# Patient Record
Sex: Male | Born: 1940 | Race: Black or African American | Hispanic: No
Health system: Southern US, Community
[De-identification: ages and names within clinical notes are randomized; demographics above are authoritative.]

---

## 2003-08-27 ENCOUNTER — Other Ambulatory Visit: Payer: Self-pay

## 2004-04-01 ENCOUNTER — Ambulatory Visit: Payer: Self-pay | Admitting: Anesthesiology

## 2004-04-22 ENCOUNTER — Emergency Department: Payer: Self-pay | Admitting: Emergency Medicine

## 2004-04-22 ENCOUNTER — Other Ambulatory Visit: Payer: Self-pay

## 2004-04-22 ENCOUNTER — Ambulatory Visit: Payer: Self-pay | Admitting: Anesthesiology

## 2004-06-17 ENCOUNTER — Ambulatory Visit: Payer: Self-pay | Admitting: Anesthesiology

## 2005-04-17 ENCOUNTER — Ambulatory Visit: Payer: Self-pay | Admitting: Family Medicine

## 2005-05-08 ENCOUNTER — Ambulatory Visit: Payer: Self-pay | Admitting: Family Medicine

## 2005-05-11 ENCOUNTER — Ambulatory Visit: Payer: Self-pay | Admitting: Gastroenterology

## 2005-05-14 ENCOUNTER — Ambulatory Visit: Payer: Self-pay | Admitting: Gastroenterology

## 2005-07-01 ENCOUNTER — Ambulatory Visit: Payer: Self-pay | Admitting: Family Medicine

## 2005-07-01 ENCOUNTER — Ambulatory Visit: Payer: Self-pay | Admitting: Nephrology

## 2005-07-14 ENCOUNTER — Ambulatory Visit: Payer: Self-pay | Admitting: Internal Medicine

## 2005-08-06 ENCOUNTER — Ambulatory Visit: Payer: Self-pay | Admitting: Internal Medicine

## 2005-09-09 ENCOUNTER — Ambulatory Visit (HOSPITAL_COMMUNITY): Admission: RE | Admit: 2005-09-09 | Discharge: 2005-09-09 | Payer: Self-pay | Admitting: *Deleted

## 2005-09-23 ENCOUNTER — Ambulatory Visit: Payer: Self-pay | Admitting: *Deleted

## 2005-09-28 ENCOUNTER — Ambulatory Visit: Payer: Self-pay | Admitting: *Deleted

## 2005-10-12 ENCOUNTER — Ambulatory Visit: Payer: Self-pay | Admitting: *Deleted

## 2005-11-11 ENCOUNTER — Ambulatory Visit: Payer: Self-pay | Admitting: Neurology

## 2006-01-18 ENCOUNTER — Ambulatory Visit: Payer: Self-pay | Admitting: Internal Medicine

## 2006-01-23 ENCOUNTER — Emergency Department: Payer: Self-pay | Admitting: Emergency Medicine

## 2006-02-06 ENCOUNTER — Ambulatory Visit: Payer: Self-pay | Admitting: Internal Medicine

## 2007-01-07 ENCOUNTER — Ambulatory Visit: Payer: Self-pay | Admitting: Internal Medicine

## 2007-01-17 ENCOUNTER — Ambulatory Visit: Payer: Self-pay | Admitting: Vascular Surgery

## 2007-01-19 ENCOUNTER — Ambulatory Visit: Payer: Self-pay | Admitting: Internal Medicine

## 2007-02-07 ENCOUNTER — Ambulatory Visit: Payer: Self-pay | Admitting: Internal Medicine

## 2007-03-09 ENCOUNTER — Ambulatory Visit: Payer: Self-pay | Admitting: Internal Medicine

## 2007-04-09 ENCOUNTER — Ambulatory Visit: Payer: Self-pay | Admitting: Internal Medicine

## 2007-04-14 ENCOUNTER — Emergency Department: Payer: Self-pay | Admitting: Emergency Medicine

## 2007-04-19 ENCOUNTER — Ambulatory Visit: Payer: Self-pay | Admitting: Internal Medicine

## 2007-05-09 ENCOUNTER — Ambulatory Visit: Payer: Self-pay | Admitting: Internal Medicine

## 2007-05-30 ENCOUNTER — Encounter: Payer: Self-pay | Admitting: Family Medicine

## 2007-08-15 ENCOUNTER — Ambulatory Visit: Payer: Self-pay | Admitting: Internal Medicine

## 2007-08-29 ENCOUNTER — Other Ambulatory Visit: Payer: Self-pay

## 2007-08-29 ENCOUNTER — Inpatient Hospital Stay: Payer: Self-pay | Admitting: Internal Medicine

## 2007-09-07 ENCOUNTER — Ambulatory Visit: Payer: Self-pay | Admitting: Internal Medicine

## 2007-10-07 ENCOUNTER — Ambulatory Visit: Payer: Self-pay | Admitting: Internal Medicine

## 2007-12-07 ENCOUNTER — Ambulatory Visit: Payer: Self-pay | Admitting: Internal Medicine

## 2007-12-21 ENCOUNTER — Ambulatory Visit: Payer: Self-pay | Admitting: Internal Medicine

## 2008-01-07 ENCOUNTER — Ambulatory Visit: Payer: Self-pay | Admitting: Internal Medicine

## 2008-01-25 ENCOUNTER — Ambulatory Visit: Payer: Self-pay | Admitting: Vascular Surgery

## 2008-02-29 ENCOUNTER — Ambulatory Visit: Payer: Self-pay | Admitting: Gastroenterology

## 2008-03-17 ENCOUNTER — Emergency Department: Payer: Self-pay | Admitting: Emergency Medicine

## 2008-06-08 ENCOUNTER — Ambulatory Visit: Payer: Self-pay | Admitting: Internal Medicine

## 2008-06-13 ENCOUNTER — Ambulatory Visit: Payer: Self-pay | Admitting: Internal Medicine

## 2008-07-09 ENCOUNTER — Ambulatory Visit: Payer: Self-pay | Admitting: Internal Medicine

## 2008-08-06 ENCOUNTER — Inpatient Hospital Stay: Payer: Self-pay | Admitting: Internal Medicine

## 2008-11-30 ENCOUNTER — Ambulatory Visit: Payer: Self-pay | Admitting: Family Medicine

## 2008-12-14 ENCOUNTER — Emergency Department: Payer: Self-pay | Admitting: Emergency Medicine

## 2009-01-29 ENCOUNTER — Inpatient Hospital Stay: Payer: Self-pay | Admitting: General Practice

## 2009-03-25 ENCOUNTER — Emergency Department: Payer: Self-pay | Admitting: Emergency Medicine

## 2009-08-27 ENCOUNTER — Emergency Department: Payer: Self-pay | Admitting: Emergency Medicine

## 2009-08-30 ENCOUNTER — Ambulatory Visit: Payer: Self-pay | Admitting: Internal Medicine

## 2009-09-06 ENCOUNTER — Ambulatory Visit: Payer: Self-pay | Admitting: Internal Medicine

## 2009-09-13 ENCOUNTER — Ambulatory Visit: Payer: Self-pay | Admitting: Internal Medicine

## 2009-09-25 ENCOUNTER — Ambulatory Visit: Payer: Self-pay | Admitting: Internal Medicine

## 2009-10-10 ENCOUNTER — Ambulatory Visit: Payer: Self-pay | Admitting: Internal Medicine

## 2009-10-24 ENCOUNTER — Ambulatory Visit: Payer: Self-pay | Admitting: Internal Medicine

## 2009-11-11 ENCOUNTER — Ambulatory Visit: Payer: Self-pay | Admitting: Internal Medicine

## 2010-01-06 ENCOUNTER — Ambulatory Visit: Payer: Self-pay | Admitting: Internal Medicine

## 2010-02-06 ENCOUNTER — Ambulatory Visit: Payer: Self-pay | Admitting: Internal Medicine

## 2010-02-21 ENCOUNTER — Ambulatory Visit: Payer: Self-pay | Admitting: Internal Medicine

## 2010-03-08 ENCOUNTER — Ambulatory Visit: Payer: Self-pay | Admitting: Internal Medicine

## 2010-10-24 ENCOUNTER — Ambulatory Visit: Payer: Self-pay | Admitting: Ophthalmology

## 2010-10-28 ENCOUNTER — Ambulatory Visit: Payer: Self-pay | Admitting: Ophthalmology

## 2011-03-02 ENCOUNTER — Ambulatory Visit: Payer: Self-pay | Admitting: Internal Medicine

## 2011-03-09 ENCOUNTER — Ambulatory Visit: Payer: Self-pay | Admitting: Internal Medicine

## 2011-04-09 ENCOUNTER — Ambulatory Visit: Payer: Self-pay | Admitting: Internal Medicine

## 2012-04-21 ENCOUNTER — Ambulatory Visit: Payer: Self-pay | Admitting: Family

## 2013-07-06 ENCOUNTER — Ambulatory Visit: Payer: Self-pay | Admitting: Gastroenterology

## 2013-07-06 LAB — DRUG SCREEN, URINE

## 2013-07-08 LAB — PATHOLOGY REPORT

## 2013-08-23 ENCOUNTER — Inpatient Hospital Stay: Payer: Self-pay | Admitting: Internal Medicine

## 2013-08-23 LAB — CBC WITH DIFFERENTIAL/PLATELET
BASOS PCT: 0.8 %
Basophil #: 0.1 10*3/uL (ref 0.0–0.1)
EOS PCT: 1.1 %
Eosinophil #: 0.1 10*3/uL (ref 0.0–0.7)
HCT: 40.8 % (ref 40.0–52.0)
HGB: 13 g/dL (ref 13.0–18.0)
Lymphocyte #: 0.7 10*3/uL — ABNORMAL LOW (ref 1.0–3.6)
Lymphocyte %: 11.3 %
MCH: 27.4 pg (ref 26.0–34.0)
MCHC: 31.9 g/dL — AB (ref 32.0–36.0)
MCV: 86 fL (ref 80–100)
MONO ABS: 0.4 x10 3/mm (ref 0.2–1.0)
Monocyte %: 6.5 %
NEUTROS PCT: 80.3 %
Neutrophil #: 5.1 10*3/uL (ref 1.4–6.5)
Platelet: 126 10*3/uL — ABNORMAL LOW (ref 150–440)
RBC: 4.74 10*6/uL (ref 4.40–5.90)
RDW: 15.6 % — AB (ref 11.5–14.5)
WBC: 6.4 10*3/uL (ref 3.8–10.6)

## 2013-08-23 LAB — COMPREHENSIVE METABOLIC PANEL
Albumin: 3.5 g/dL (ref 3.4–5.0)
Alkaline Phosphatase: 125 U/L — ABNORMAL HIGH
Anion Gap: 6 — ABNORMAL LOW (ref 7–16)
BUN: 46 mg/dL — ABNORMAL HIGH (ref 7–18)
Bilirubin,Total: 0.4 mg/dL (ref 0.2–1.0)
Calcium, Total: 8.7 mg/dL (ref 8.5–10.1)
Chloride: 106 mmol/L (ref 98–107)
Co2: 26 mmol/L (ref 21–32)
Creatinine: 2.56 mg/dL — ABNORMAL HIGH (ref 0.60–1.30)
EGFR (African American): 27 — ABNORMAL LOW
EGFR (Non-African Amer.): 24 — ABNORMAL LOW
Glucose: 113 mg/dL — ABNORMAL HIGH (ref 65–99)
Osmolality: 288 (ref 275–301)
Potassium: 4.8 mmol/L (ref 3.5–5.1)
SGOT(AST): 29 U/L (ref 15–37)
SGPT (ALT): 20 U/L (ref 12–78)
Sodium: 138 mmol/L (ref 136–145)
Total Protein: 7.8 g/dL (ref 6.4–8.2)

## 2013-08-23 LAB — URINALYSIS, COMPLETE
Bilirubin,UR: NEGATIVE
Blood: NEGATIVE
Glucose,UR: NEGATIVE mg/dL (ref 0–75)
Ketone: NEGATIVE
Nitrite: POSITIVE
Ph: 6 (ref 4.5–8.0)
RBC,UR: 1 /HPF (ref 0–5)
Specific Gravity: 1.008 (ref 1.003–1.030)
WBC UR: 4 /HPF (ref 0–5)

## 2013-08-24 LAB — CBC WITH DIFFERENTIAL/PLATELET
Basophil #: 0 10*3/uL (ref 0.0–0.1)
Basophil %: 0.6 %
EOS ABS: 0.1 10*3/uL (ref 0.0–0.7)
Eosinophil %: 1.8 %
HCT: 31.3 % — ABNORMAL LOW (ref 40.0–52.0)
HGB: 10.7 g/dL — AB (ref 13.0–18.0)
Lymphocyte #: 0.9 10*3/uL — ABNORMAL LOW (ref 1.0–3.6)
Lymphocyte %: 16.3 %
MCH: 29.2 pg (ref 26.0–34.0)
MCHC: 34.3 g/dL (ref 32.0–36.0)
MCV: 85 fL (ref 80–100)
MONO ABS: 0.7 x10 3/mm (ref 0.2–1.0)
MONOS PCT: 11.8 %
Neutrophil #: 3.9 10*3/uL (ref 1.4–6.5)
Neutrophil %: 69.5 %
Platelet: 119 10*3/uL — ABNORMAL LOW (ref 150–440)
RBC: 3.67 10*6/uL — AB (ref 4.40–5.90)
RDW: 15 % — ABNORMAL HIGH (ref 11.5–14.5)
WBC: 5.6 10*3/uL (ref 3.8–10.6)

## 2013-08-24 LAB — BASIC METABOLIC PANEL
ANION GAP: 6 — AB (ref 7–16)
BUN: 44 mg/dL — AB (ref 7–18)
Calcium, Total: 8 mg/dL — ABNORMAL LOW (ref 8.5–10.1)
Chloride: 105 mmol/L (ref 98–107)
Co2: 25 mmol/L (ref 21–32)
Creatinine: 2.69 mg/dL — ABNORMAL HIGH (ref 0.60–1.30)
EGFR (Non-African Amer.): 22 — ABNORMAL LOW
GFR CALC AF AMER: 26 — AB
GLUCOSE: 117 mg/dL — AB (ref 65–99)
Osmolality: 284 (ref 275–301)
Potassium: 4.2 mmol/L (ref 3.5–5.1)
Sodium: 136 mmol/L (ref 136–145)

## 2013-08-25 LAB — URINALYSIS, COMPLETE
Glucose,UR: NEGATIVE mg/dL (ref 0–75)
Ketone: NEGATIVE
Nitrite: POSITIVE
PH: 5 (ref 4.5–8.0)
RBC,UR: 1 /HPF (ref 0–5)
SPECIFIC GRAVITY: 1.011 (ref 1.003–1.030)

## 2013-08-25 LAB — CBC WITH DIFFERENTIAL/PLATELET
BASOS PCT: 0.4 %
Basophil #: 0 10*3/uL (ref 0.0–0.1)
Eosinophil #: 0.2 10*3/uL (ref 0.0–0.7)
Eosinophil %: 2.7 %
HCT: 29.7 % — ABNORMAL LOW (ref 40.0–52.0)
HGB: 10 g/dL — AB (ref 13.0–18.0)
LYMPHS ABS: 1 10*3/uL (ref 1.0–3.6)
Lymphocyte %: 15.5 %
MCH: 28.7 pg (ref 26.0–34.0)
MCHC: 33.7 g/dL (ref 32.0–36.0)
MCV: 85 fL (ref 80–100)
MONO ABS: 1 x10 3/mm (ref 0.2–1.0)
MONOS PCT: 14.9 %
Neutrophil #: 4.4 10*3/uL (ref 1.4–6.5)
Neutrophil %: 66.5 %
Platelet: 116 10*3/uL — ABNORMAL LOW (ref 150–440)
RBC: 3.48 10*6/uL — ABNORMAL LOW (ref 4.40–5.90)
RDW: 15.2 % — ABNORMAL HIGH (ref 11.5–14.5)
WBC: 6.6 10*3/uL (ref 3.8–10.6)

## 2013-08-26 LAB — CBC WITH DIFFERENTIAL/PLATELET
BASOS ABS: 0 10*3/uL (ref 0.0–0.1)
Basophil %: 0.5 %
EOS ABS: 0.2 10*3/uL (ref 0.0–0.7)
EOS PCT: 3.5 %
HCT: 30.2 % — ABNORMAL LOW (ref 40.0–52.0)
HGB: 10.5 g/dL — ABNORMAL LOW (ref 13.0–18.0)
Lymphocyte #: 0.6 10*3/uL — ABNORMAL LOW (ref 1.0–3.6)
Lymphocyte %: 9.2 %
MCH: 29.5 pg (ref 26.0–34.0)
MCHC: 34.6 g/dL (ref 32.0–36.0)
MCV: 85 fL (ref 80–100)
MONO ABS: 1.4 x10 3/mm — AB (ref 0.2–1.0)
MONOS PCT: 21.9 %
NEUTROS PCT: 64.9 %
Neutrophil #: 4.1 10*3/uL (ref 1.4–6.5)
PLATELETS: 129 10*3/uL — AB (ref 150–440)
RBC: 3.55 10*6/uL — AB (ref 4.40–5.90)
RDW: 14.8 % — ABNORMAL HIGH (ref 11.5–14.5)
WBC: 6.3 10*3/uL (ref 3.8–10.6)

## 2013-08-26 LAB — BASIC METABOLIC PANEL
ANION GAP: 8 (ref 7–16)
BUN: 43 mg/dL — AB (ref 7–18)
Calcium, Total: 8.5 mg/dL (ref 8.5–10.1)
Chloride: 100 mmol/L (ref 98–107)
Co2: 24 mmol/L (ref 21–32)
Creatinine: 2.81 mg/dL — ABNORMAL HIGH (ref 0.60–1.30)
EGFR (Non-African Amer.): 21 — ABNORMAL LOW
GFR CALC AF AMER: 25 — AB
Glucose: 110 mg/dL — ABNORMAL HIGH (ref 65–99)
Osmolality: 276 (ref 275–301)
POTASSIUM: 4.6 mmol/L (ref 3.5–5.1)
Sodium: 132 mmol/L — ABNORMAL LOW (ref 136–145)

## 2013-08-26 LAB — HEMOGLOBIN: HGB: 10.3 g/dL — ABNORMAL LOW (ref 13.0–18.0)

## 2013-08-26 LAB — URINE CULTURE

## 2013-08-27 LAB — CBC WITH DIFFERENTIAL/PLATELET
Basophil #: 0 10*3/uL (ref 0.0–0.1)
Basophil %: 0.5 %
EOS ABS: 0.2 10*3/uL (ref 0.0–0.7)
EOS PCT: 2.6 %
HCT: 30.2 % — ABNORMAL LOW (ref 40.0–52.0)
HGB: 10.3 g/dL — ABNORMAL LOW (ref 13.0–18.0)
Lymphocyte #: 0.6 10*3/uL — ABNORMAL LOW (ref 1.0–3.6)
Lymphocyte %: 9.7 %
MCH: 29.2 pg (ref 26.0–34.0)
MCHC: 34 g/dL (ref 32.0–36.0)
MCV: 86 fL (ref 80–100)
Monocyte #: 1.5 x10 3/mm — ABNORMAL HIGH (ref 0.2–1.0)
Monocyte %: 24.7 %
Neutrophil #: 3.9 10*3/uL (ref 1.4–6.5)
Neutrophil %: 62.5 %
Platelet: 144 10*3/uL — ABNORMAL LOW (ref 150–440)
RBC: 3.53 10*6/uL — ABNORMAL LOW (ref 4.40–5.90)
RDW: 14.9 % — ABNORMAL HIGH (ref 11.5–14.5)
WBC: 6.3 10*3/uL (ref 3.8–10.6)

## 2013-08-27 LAB — BASIC METABOLIC PANEL
ANION GAP: 7 (ref 7–16)
BUN: 44 mg/dL — ABNORMAL HIGH (ref 7–18)
CO2: 23 mmol/L (ref 21–32)
CREATININE: 2.82 mg/dL — AB (ref 0.60–1.30)
Calcium, Total: 8.4 mg/dL — ABNORMAL LOW (ref 8.5–10.1)
Chloride: 103 mmol/L (ref 98–107)
EGFR (African American): 24 — ABNORMAL LOW
GFR CALC NON AF AMER: 21 — AB
Glucose: 83 mg/dL (ref 65–99)
Osmolality: 277 (ref 275–301)
Potassium: 4.6 mmol/L (ref 3.5–5.1)
Sodium: 133 mmol/L — ABNORMAL LOW (ref 136–145)

## 2013-08-27 LAB — URIC ACID: Uric Acid: 6.6 mg/dL (ref 3.5–7.2)

## 2013-08-27 LAB — URINE CULTURE

## 2013-08-28 LAB — HEPATIC FUNCTION PANEL A (ARMC)
Albumin: 2.5 g/dL — ABNORMAL LOW (ref 3.4–5.0)
Alkaline Phosphatase: 88 U/L
BILIRUBIN DIRECT: 0.3 mg/dL — AB (ref 0.00–0.20)
BILIRUBIN TOTAL: 0.5 mg/dL (ref 0.2–1.0)
SGOT(AST): 67 U/L — ABNORMAL HIGH (ref 15–37)
SGPT (ALT): 32 U/L (ref 12–78)
Total Protein: 6.7 g/dL (ref 6.4–8.2)

## 2013-08-28 LAB — CREATININE, SERUM
Creatinine: 2.86 mg/dL — ABNORMAL HIGH (ref 0.60–1.30)
EGFR (African American): 24 — ABNORMAL LOW
EGFR (Non-African Amer.): 21 — ABNORMAL LOW

## 2013-08-28 LAB — SYNOVIAL CELL COUNT + DIFF, W/ CRYSTALS
Basophil: 0 %
CRYSTALS, JOINT FLUID: NONE SEEN
Eosinophil: 3 %
Lymphocytes: 6 %
NEUTROS PCT: 84 %
Nucleated Cell Count: 11410 /mm3
OTHER CELLS BF: 0 %
Other Mononuclear Cells: 7 %

## 2013-08-28 LAB — SEDIMENTATION RATE: Erythrocyte Sed Rate: 81 mm/hr — ABNORMAL HIGH (ref 0–20)

## 2013-08-28 LAB — RAPID HIV-1/2 QL/CONFIRM: HIV-1/2,Rapid Ql: NEGATIVE

## 2013-08-30 LAB — CULTURE, BLOOD (SINGLE)

## 2013-08-30 LAB — WOUND CULTURE

## 2013-09-01 LAB — BODY FLUID CULTURE

## 2013-11-09 ENCOUNTER — Encounter: Payer: Self-pay | Admitting: Surgery

## 2013-11-13 LAB — WOUND AEROBIC CULTURE

## 2013-12-06 ENCOUNTER — Encounter: Payer: Self-pay | Admitting: Surgery

## 2014-07-09 DEATH — deceased

## 2014-09-29 NOTE — H&P (Signed)
PATIENT NAME:  Erik Harvey, ZOLL MR#:  [id number] DATE OF BIRTH:  [birth year]  DATE OF ADMISSION:  08/23/2013  PRIMARY CARE PHYSICIAN: [facility].  EMERGENCY ROOM PHYSICIAN: Dr. Margarita Grizzle.   CHIEF COMPLAINT: Fall.   HISTORY OF PRESENT ILLNESS: The patient is a 74 year old male patient with multiple medical problems of hypertension, diabetes, legal blindness. Went to [employer] today. The patient is a wheelchair-bound patient. The patient was waiting for his ride. On the way out the patient did not see the step down, and the patient fell forward from the wheelchair, hitting his head on the door and also injuring the right knee. The patient suffered a right knee fracture and also pubic ramus fracture, and theseprobably need conservative treatment.t. The patient will be admitted to medical service. Will consult orthopedic physician, Dr. Martha Clan on call. The patient denies any chest pain or trouble breathing. No cough. No fever. Lives alone. Legally blind in the right eye and completely blind in the left eye and uses wheelchair for his activities.   PAST MEDICAL HISTORY: Significant for:  1.  Hypertension. 2.  History of monoclonal gammopathy of unknown significance. 3.  History of diabetes mellitus. 4.  Coronary artery disease.  5.  Peripheral vascular disease.  6.  Glaucoma.  7.  Low back pain.  8.  Also has a history of hepatitis C. 9.  History of polysubstance abuse in the past.   PAST SURGICAL HISTORY: Significant for right hip surgery.   FAMILY HISTORY: The patient's father had lung cancer, and sister had breast cancer.   SOCIAL HISTORY: Smoker. The patient tried to quit before but unsuccessful, smoked for about 50 to 60 years and smokes about 1 pack per day. No alcohol. No drugs. Used to use recreational drugs before.   ALLERGIES: LISINOPRIL AND IBUPROFEN.   MEDICATIONS: We are still waiting for the [facility] to send the medication list, but the patient's medications previous as  per family: The patient is on amlodipine 10 mg daily, Aldactone 25 mg p.o. daily, tamsulosin 0.4 mg daily. The patient is also on sucralfate 1 gram p.o. 4 times daily, Imdur 30 mg once a day, Actos 30 mg p.o. daily, Crestor 10 mg p.o. daily, trazodone 50 mg at bedtime, clonidine 0.2 mg p.o. b.i.d., aspirin 81 mg daily, omeprazole 20 mg p.o. daily, Zoloft 100 mg at bedtime, hydralazine 20 mg p.o. t.i.d., olanzapine 5 mg p.o. at bedtime. This medication list will be updated once we get it from [facility], and once pharmacy puts them in the computer, we will reassess them.   REVIEW OF SYSTEMS:   CONSTITUTIONAL: No fever. No fatigue. No weakness.  EYES: The patient has legal blindness in the right eye and completely blind in the left eye.  EARS, NOSE, THROAT: No tinnitus. No ear pain. No epistaxis. No difficulty swallowing.  RESPIRATORY: No cough. No wheezing.  CARDIOVASCULAR: No chest pain. No orthopnea.  GASTROINTESTINAL: No nausea. No vomiting. No abdominal pain.  GENITOURINARY: No dysuria. No hematuria.  ENDOCRINE: No polyuria or nocturia.  INTEGUMENTARY: No skin rashes.  MUSCULOSKELETAL: No joint pains.  NEUROLOGIC: No numbness or weakness.  PSYCHIATRIC: No anxiety or insomnia.   PHYSICAL EXAMINATION:  VITAL SIGNS: Temperature 98.4, heart rate 58, blood pressure 177/85, sats 100% on room air.  GENERAL: Has a head laceration, which was stitched in the Emergency Room and also has right conjunctival hemorrhage. The patient is alert, awake, oriented, answering questions appropriately.  HEAD: Atraumatic, normocephalic.  EYES: Pupils equally reacting to  light. Extraocular movements are intact.  EARS, NOSE, THROAT: No tympanic membrane congestion. No turbinate hypertrophy. No oropharyngeal erythema.  NECK: Supple. No JVD. No carotid bruit. Normal range of motion. The patient's neck is nontender.  RESPIRATORY: Good respiratory effort. Clear to auscultation. No wheeze. No rales. The patient is not  using accessory muscles of respiration.  CARDIOVASCULAR: S1, S2 regular. No murmurs. The patient has femoral and pedal pulse intact. No peripheral edema.  GASTROINTESTINAL: Abdomen is nontender, nondistended. Bowel sounds present. No organomegaly. No hernias.  MUSCULOSKELETAL: The patient does have right knee pain, and the patient's right knee is immobilized.  SKIN: Inspection is normal.  LYMPHATICS: No lymphadenopathy.  NEUROLOGIC: Cranial nerves II through XII are intact.  Power 5/5 in upper and lower extremities. Sensation is intact. DTRs 2+ bilaterally.  PSYCHIATRIC: Mood and affect are within normal limits.   LABORATORY AND RADIOLOGICAL DATAxray right knee showed :A cute mildly displaced distal transverse fracture of the distal femoral metaphysis is noted which was described on prior exam; the previously placed intra medullary rod provides no stability for this fracture.  Right knee:  Transverse fracture, relatively nondisplaced, through the distal femoral metaphysis, in the vicinity of the tip of the intramedullary nail. I do not see definite extension to the intercondylar notch although negative predictive value for such extension is low due to the severity of spurring and bony demineralization. CT may be warranted for complete characterization. 2. 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 3. Atherosclerosis. 4. Heterotopic ossification in the distal femur and along the proximal pole of the patella.  . Hip x-ray on the right side  shows Multiple chronic abnormalities. Hairline fracture right pubic ramus suspected.   intramedullary rod in the right femur present. Cervical spine CT is normal. Head CT shows frontal scalp laceration with soft tissue swelling, negative for skull fracture.   Electrolytes: Sodium is 138, potassium 4.8, chloride 106, bicarbonate 6; BUN is 46, creatinine 2.56, glucose 113. WBC 6.4, hemoglobin 13, hematocrit 40.8, platelets 126.  Urinalysis is clear, no leukocyte esterase.   ASSESSMENT AND PLAN: The patient is a 74 year old male patient with:  1.  Fall, suffered a right femur fracture and also pubic ramus fracture. The patient is admitted to observation status and will continue pain medications with Dilaudid 2 mg every 4 hours and continue Lovenox 40 mg subcutaneous daily. Obtain orthopedic consult with Dr. Martha ClanKrasinski.  2.  Chronic renal insufficiency, stable kidney function.  3.  Hypertension. The patient is on clonidine, furosemide, hydralazine, Imdur. At this time, we will continue them. Once we get the list, we are going to update the medications.  4.  History of peripheral vascular disease.  Aspirin and statins to be continued.  5.  Diabetes mellitus type 2. Continue sliding scale with coverage along with Actos.  6.  Gastrointestinal prophylaxis with proton pump inhibitors.  7.  Benign prostatic hypertrophy. Continue Flomax. 8.  Scalp laceration, status post suturing in the Emergency Room. The patient also received tetanus toxoid/Tdap vaccine in the ER.   TIME SPENT: About 60 minutes.    ____________________________ Katha HammingSnehalatha Dawnisha Marquina, MD sk:jcm D: 08/23/2013 15:38:39 ET T: 08/23/2013 18:17:07 ET JOB#: [id number]  cc: Katha HammingSnehalatha Elvie Maines, MD, <Dictator> Katha HammingSNEHALATHA Tonyia Marschall MD ELECTRONICALLY SIGNED 09/22/2013 21:49

## 2014-09-29 NOTE — Discharge Summary (Signed)
PATIENT NAME:  Erik Harvey, Erik Harvey MR#:  [id number] DATE OF BIRTH:  1940-07-23  DATE OF ADMISSION:  08/23/2013 DATE OF DISCHARGE:  08/30/2013  PRIMARY CARE PHYSICIAN: Viviann Spare, MD  FINAL DIAGNOSES:  1.  Fever.  2.  Right femoral fracture.  3.  Right pubic ramus fracture.  4.  Hypertension.  5.  Chronic kidney disease.  6.  Acute blood loss anemia.  7.  Diabetes.  8.  Constipation.  9.  History of seizure.  10. Benign prostatic hypertrophy.  11. Gastroesophageal reflux disease.   MEDICATIONS ON DISCHARGE: Include omeprazole 20 mg twice a day, Avodart 0.5 mg daily, Dilantin 100 mg 3 times a day, spironolactone 25 mg once a day, Drisdol 50,000 units 1 capsule once a week on Mondays for 8 weeks then once a month for vitamin D supplement, Coreg 12.5 mg twice a day, Crestor 20 mg at bedtime, Colace 100 mg 1 to 2 capsules once a day at bedtime as needed for constipation, Naftin 1% topical cream applied to foot between toenails and toes twice a day until clear, sodium bicarbonate 650 mg orally once a day, Ventolin HFA 2 puffs every 4 to 6 hours as needed for cough or shortness of breath, triamcinolone topical 0.1% topical cream applied to rash on top of feet and ankles twice a day until clear, Tylenol 650 mg 2 tablets every 8 hours as needed for pain, trazodone 50 mg 1.5 tablets 75 mg orally once a day at bedtime for insomnia, furosemide 40 mg twice a day for blood pressure and edema,  topical ointment apply to affected area twice a day as needed for dryness, Combigan 0.2%/0.5% ophthalmic solution one drop right eye 3 times a day for glaucoma, MiraLax 17 grams in 8 ounces of water daily, Imdur 30 mg daily, hydralazine 50 mg 3 times a day, Zoloft 100 mg at bedtime, Norvasc 10 mg daily, Carafate 1 gram orally 3 times a day, aspirin 81 mg daily, Travatan 0.004% ophthalmic solution 1 drop left eye once a day at bedtime, Zyprexa 5 mg at bedtime, Flomax 0.4 mg daily, acetaminophen/oxycodone 10/325, 1 tablet every  4 hours as needed for pain, clonidine 0.2 mg twice a day, enoxaparin 30 mg subcutaneous injection once a day, doxycycline 100 mg every 12 hours for 7 days then stop and Glucerna shake 237 mL twice a day.   TREATMENT: Right leg immobilizer.   DIET: Low-sodium diet, regular consistency.   ACTIVITY: As tolerated.  FOLLOWUP: With Dr. Mack Guise orthopedics in 2 weeks, physical therapy in 1 to 2 weeks with doctor at rehab.   HOSPITAL COURSE: The patient was admitted August 23, 2013 and discharged August 30, 2013. Please see discharge summary dictated by Dr. Darvin Neighbours on March 21. This will be an addendum on the hospital course from then. The reason why the patient was not discharged from the hospital on the 21st was fever. He continued to have fevers. He spiked a temperature as high as 104. The patient was initially thought to have a urinary tract infection and was initially placed on IV Rocephin. The patient continued to spike fever, was increased on antibiotics to Invanz and vancomycin. Dr. Sabra Heck orthopedics covering for Dr. Mack Guise saw the patient and did a tap of the knee. Dr. Ola Spurr from infectious disease saw the patient on March 23 did a retap of the right knee. It does not look like the knee is the source of infection. Blood cultures were negative. White count normal range. Temperature curve did come  down and currently afebrile with a temperature of 98.8 on March 25. The patient was switched over to oral doxycycline and will continue another week course of this. I did leave him on Lovenox injections, subcutaneous injection every 24 hours until more ambulatory. Can walk with physical therapy, but has been pretty limited here in the hospital. Last creatinine was 2.86. HIV test was negative. C-reactive protein up at 258, ESR up at 8. Last hemoglobin 10.3. Uric acid 6.6.   TIME SPENT ON DISCHARGE: 35 minutes.   DISPOSITION: The patient discharged to rehab in stable condition.   ____________________________ [name]. [name], MD rjw:aw D: 08/30/2013 08:53:10 ET T: 08/30/2013 08:59:07 ET JOB#: [id number]  cc: [name]. [name], MD, <Dictator> Colette S. Zenia Resides, NP Marisue Brooklyn MD ELECTRONICALLY SIGNED 09/01/2013 16:29

## 2014-09-29 NOTE — Consult Note (Signed)
Brief Consult Note: Diagnosis: Right distal femur fracture.   Patient was seen by consultant.   Recommend further assessment or treatment.   Orders entered.   Comments: Patient is a 74 year old male who fell out of his wheelchair today when he went outside to check on his ride and went over a curb throwing him forward out of the chair.  He hit his forehead, just above his right eye, and right knee on the ground.  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  [facility] where his laceration was sutured closed.  He was diagnosed with a fracture of the right femur just below a long rod placed by Dr. Ernest PineHooten in 2010 for a comminuted intertrochanteric hip fracture.  I am consulted regarding this fracture.  On exam, he has intact skin over the right knee with a hemarthrosis.  There is global tenderness around the knee.  Knee motion was not tested.  The leg and thigh compartments are soft and compressible.  Distally he can flex and extend his toes and ankle.  He has palpable pedal pulses, but sensation is diminished due to diabetic neuropathy which is chronic.  Radiographs from ER demonstrate a non-displaced fracture of the right distal femur which has a transverse component at the tip of the intramedullary rod.  There appears to be an intercondylar component as well which is also non-displaced.  I am recommending non-operative management of this fracture at this time as the fracture is non-displaced and the patient is non-ambulatory at baseline.   He is in a knee immobilizer which he should wear at all times.  He should ice and elevate.  PT evaluation in the morning for NWB on right LE for transfers to a chair or wheelchair.  He will likely need SNF placement as he lives alone and will likely be unsafe to go  home alone.  Electronic Signatures: Juanell FairlyKrasinski, Rielly Corlett (MD)  (Signed 18-Mar-15 21:19)  Authored: Brief Consult Note   Last Updated: 18-Mar-15 21:19 by Juanell FairlyKrasinski, Gionni Freese  (MD)

## 2014-09-29 NOTE — Discharge Summary (Signed)
PATIENT NAME:  Eligha BridegroomATE, Ariel W MR#:  [id number] DATE OF BIRTH:  1941-01-29  DATE OF ADMISSION:  08/23/2013  DATE OF DISCHARGE:  08/26/2013  DISPOSITION:  Discharged to a skilled nursing facility.   DISCHARGE DIAGNOSES: 1.  Right femur fracture.  2.  Right pubic ramus fracture.  3.  Urinary tract infection.  4.  Atelectasis.  5.  Acute blood loss anemia.  6.  Hypertension.  7.  Diabetes mellitus.  8.  Chronic kidney disease stage III.   IMAGING STUDIES: Done include an x-ray of the hip and knee, which showed a right distal femoral fracture and hairline right pubic ramus fracture.   CONSULTATIONS: Dr. Martha ClanKrasinski.   ADMITTING HISTORY AND PHYSICAL: Please see detailed H and P dictated earlier by Dr. Luberta MutterKonidena. In brief, a [age] year old African American male patient, who is wheelchair bound, fell off from a step when his wheelchair rolled.  The patient fell forward hitting his head. Presented to the Emergency Room where his CT scan of the head and neck were normal without any fractures or dislocation, but his right femur fracture and pubic ramus fracture was found, admitted to the hospitalist service.   HOSPITAL COURSE: The patient was seen by orthopedics, who had suggested conservative management as the patient is wheelchair bound at baseline, unable to walk. The patient has had good pain control with pain medications. He did have fever of 101 today, on 08/25/2013, likely from the UTI, has been started on ceftriaxone, although his fever could also be originating from atelectasis found on the chest x-ray. I have added incentive spirometer. At the time of discharge, the patient can be discharged on oral ciprofloxacin depending on culture results. If the patient is afebrile on 08/26/2013, and doing well, he can be discharged back to skilled nursing facility. He has had elevated blood pressure secondary to pain medications; restarted on his home medication list, along with IV p.r.n. antihypertensive  medications.   He has mild acute blood loss anemia from the fractures. No need of transfusion. His diabetes is fairly controlled.   DISCHARGE MEDICATIONS: 1.  Percocet 10/325, 1 tablet 6 times a day as needed for pain.  2.  Clonidine 0.2 mg oral 2 times a day.  3.  Ciprofloxacin XR 500 mg oral once a day.  4.  Flomax 0.4 mg oral once a day.  5.  Zyprexa 5 mg oral once a day.  6.  Travatan  ophthalmic solution, 1 drop to left eye once a day at bedtime.  7.  Aspirin 81 mg daily.  8.  Carafate 1 gram oral 3 times a day.  9.  Norvasc 10 mg oral once a day.  10.  Zoloft 100 mg oral once a day.  11.  Hydralazine 50 mg oral 3 times a day.  12.  Isosorbide mononitrate 30 mg oral once a day.  13.  MiraLAX 17 grams once a day as needed for constipation.  14.  Combigan ophthalmic, 1 drop to right eye 3 times a day for glaucoma.  15.  Lasix 40 mg oral 2 times a day.  16.  Trazodone 50 mg 1.5 tablets oral once a day at bedtime.  17.  Tylenol 650 mg 2 tablets oral every 8 hours as needed for pain.  18.  Triamcinolone topical 0.1% cream, apply to rash 2 times a day.  19.  Sodium bicarbonate 650 mg oral once a day.  20.  Colace 100 mg 1 to 2 tablets oral once a day as needed.  21.  Crestor 20 mg daily.  22.  Coreg 12.5 mg oral 2 times a day.  23.  Drisdol 50,000 International Units oral once a week.  24.  Spironolactone 25 mg daily.  25.  Dilantin 100 mg oral 3 times a day.  26.  Avodart 0.5 mg oral once a day.  27.  Omeprazole 20 mg 2 times a day.  28.  Lovenox 30 mg subQ daily for 2 weeks.   DISCHARGE INSTRUCTIONS: Low sodium, carbohydrate-controlled diet. Activity as tolerated. Follow up with Dr. Martha Clan in nephrology in 1 to 2 weeks.   Time spent today on this case was 40 minutes.    ____________________________ Molinda Bailiff Pammie Chirino, MD srs:dmm D: 08/25/2013 14:57:00 ET T: 08/25/2013 21:24:08 ET JOB#: [id number]  cc: Wardell Heath R. Elpidio Anis, MD, <Dictator> Kathreen Devoid, MD Orie Fisherman  MD ELECTRONICALLY SIGNED 09/02/2013 10:29

## 2014-09-29 NOTE — Consult Note (Signed)
Chief Complaint:  Subjective/Chief Complaint Fever and right knee fracture.   Less pain after aspiration.  white blood count still low at 6.3   VITAL SIGNS/ANCILLARY NOTES: **Vital Signs.:   22-Mar-15 10:17  Vital Signs Type Q 4hr  Temperature Temperature (F) 99.1  Celsius 37.2  Pulse Pulse 76  Respirations Respirations 18  Systolic BP Systolic BP 379  Diastolic BP (mmHg) Diastolic BP (mmHg) 74  Mean BP 93  Pulse Ox % Pulse Ox % 97  Oxygen Delivery Room Air/ 21 %   Brief Assessment:  GEN well developed, well nourished   Respiratory normal resp effort   EXTR negative edema   Additional Physical Exam Not as warm to touch.  Alert and comfortable.  X-rays show some shifting of fracture but acceptable position.  Probable cast treatment.  No reddness or cellulitis at knee.  Early culture results negative.   Lab Results: LabObservation:  21-Mar-15 15:32   OBSERVATION Reason for Test Swelling, pain knee, r/o dvt  Routine Micro:  21-Mar-15 12:14   Micro Text Report URINE CULTURE   COMMENT                   NO GROWTH IN 18-24 HOURS   ANTIBIOTIC                       Specimen Source CLEAN CATCH  Culture Comment NO GROWTH IN 18-24 HOURS  Result(s) reported on 27 Aug 2013 at 11:22AM.    19:11   Micro Text Report WOUND AER/ANAEROBIC CULT   COMMENT                   NO GROWTH IN 8-12 HOURS   ANTIBIOTIC                       Specimen Source R KNEE  Culture Comment NO GROWTH IN 8-12 HOURS  Result(s) reported on 27 Aug 2013 at 12:56PM.  Routine Chem:  21-Mar-15 19:54   Glucose, Serum  110  BUN  43  Creatinine (comp)  2.81  Sodium, Serum  132  Potassium, Serum 4.6  Chloride, Serum 100  CO2, Serum 24  Calcium (Total), Serum 8.5  Anion Gap 8  Osmolality (calc) 276  eGFR (African American)  25  eGFR (Non-African American)  21 (eGFR values <9m/min/1.73 m2 may be an indication of chronic kidney disease (CKD). Calculated eGFR is useful in patients with stable renal  function. The eGFR calculation will not be reliable in acutely ill patients when serum creatinine is changing rapidly. It is not useful in  patients on dialysis. The eGFR calculation may not be applicable to patients at the low and high extremes of body sizes, pregnant women, and vegetarians.)  Routine Hem:  21-Mar-15 05:02   Hemoglobin (CBC)  10.3 (Result(s) reported on 26 Aug 2013 at 05:45AM.)    19:54   WBC (CBC) 6.3  RBC (CBC)  3.55  Hemoglobin (CBC)  10.5  Hematocrit (CBC)  30.2  Platelet Count (CBC)  129  MCV 85  MCH 29.5  MCHC 34.6  RDW  14.8  Neutrophil % 64.9  Lymphocyte % 9.2  Monocyte % 21.9  Eosinophil % 3.5  Basophil % 0.5  Neutrophil # 4.1  Lymphocyte #  0.6  Monocyte #  1.4  Eosinophil # 0.2  Basophil # 0.0 (Result(s) reported on 26 Aug 2013 at 08:13PM.)   Radiology Results: XRay:    21-Mar-15 22:27, Knee Right AP  and Lateral  Knee Right AP and Lateral   REASON FOR EXAM:    fever, knee effusion, recent fx  COMMENTS:       PROCEDURE: DXR - DXR KNEE RIGHT AP AND LATERAL  - Aug 26 2013 10:27PM     CLINICAL DATA:  Right leg fracture.    EXAM:  RIGHT KNEE - 1-2 VIEW    COMPARISON:  August 23, 2013.    FINDINGS:  Intramedullary rod fixation of distal femur is noted. There is again  noted acute mildly displaced transverse fracture involving the  distal femoral metaphysis which is unchanged compared to prior exam  ; the previously placed intramedullary rod provides no stability for  this new fracture. 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     IMPRESSION:  Acute mildly displaced distal transverse fracture of the distal  femoral metaphysis is noted which was described on prior exam; the  previously placed intra medullary rod provides no stability for this  fracture.      Electronically Signed    By: Sabino Dick M.D.    On: 08/27/2013 07:57     Verified By: Marveen Reeks, M.D.,   Assessment/Plan:  Assessment/Plan:   Assessment Right knee fracture and sepsis. Source of infectin pending.  Doubt knee involved.   Plan knee immobilizer for now. May need cast.  Physical Therapy as tolerated.   Electronic Signatures: Park Breed (MD)  (Signed 22-Mar-15 14:14)  Authored: Chief Complaint, VITAL SIGNS/ANCILLARY NOTES, Brief Assessment, Lab Results, Radiology Results, Assessment/Plan   Last Updated: 22-Mar-15 14:14 by Park Breed (MD)

## 2014-09-29 NOTE — Consult Note (Signed)
PATIENT NAME:  Erik Harvey, Erik Harvey MR#:  [id number] DATE OF BIRTH:  11-08-1940  DATE OF CONSULTATION:  08/28/2013  REFERRING PHYSICIAN:  Loletha Grayer, MD CONSULTING PHYSICIAN:  Cheral Marker. Ola Spurr, MD  REASON FOR CONSULTATION: Fever.   HISTORY OF PRESENT ILLNESS: This is a very pleasant 74 year old gentleman with history of prior CVAs who is wheelchair-bound. He was admitted March 18th after he fell from his wheelchair. Prior to that he had been in his usual state of health. He was found to have a right femur fracture as well as pubic ramus fracture. The patient was admitted to the medical service. It was nonoperative. The patient then however developed fevers starting on March 20th. He has spiked as high as 104.7 on March 21st. No obvious source has been discovered. He has had UA, urine culture, blood cultures, CT of his head and tap of his knee. Cultures from all of those are negative. He has been maintained on antibiotics including vancomycin and amlodipine.   Currently, the patient denies any headaches, sore throat, ear pain, neck pain or stiffness, chest pain, or shortness of breath. He does have a mild chronic cough but it is nonproductive and not changed in nature. He has had constipation since admission and has not had a bowel movement in 7 days, but he just had one today prior to our evaluation. He has some difficulty urinating where he find like he cannot completely void. He however has no dysuria.   PAST MEDICAL HISTORY: 1.  Chronic hepatitis C.  2.  Hypertension.  3.  Prior CVA.  4.  Diabetes. 5.  MGUS.  6.  Coronary artery disease.  7.  Peripheral vascular disease.  8.  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  9.  Chronic low back pain.  10.  Polysubstance abuse in the past.   PAST SURGICAL HISTORY: Right hip surgery.   FAMILY HISTORY: Positive for lung cancer in the father. Sister had breast cancer.   SOCIAL HISTORY: The patient smokes a pack a day and has for many years.  He denies any alcohol or drugs. He does not use any other recreational drugs. He says he lives at home, is able to transfer himself from his bed to his wheelchair.   ALLERGIES: LISINOPRIL AND IBUPROFEN.  REVIEW OF SYSTEMS: Eleven  systems reviewed and negative, except as per HPI.   ANTIBIOTICS SINCE ADMISSION: Include: Vancomycin, begun on March 20th as well as ertapenem begun March 20th. He also received a dose of ceftriaxone.   OTHER MEDICATIONS: Include his outpatient meds: amlodipine, clonidine, Avodart, Lasix, Imdur, Zyprexa, Zofran, pantoprazole, Crestor, spironolactone, Flomax, Percocet, enoxaparin, Tylenol, hydralazine, MiraLax, Colace, lactulose.   PHYSICAL EXAMINATION: VITALS: T-max since admission 104.7 on March 21st. He has continued to spike each day, but his most recent T-max is 102.9 on March 22nd at 5:00 p.m. Pulse is 69, blood pressure 122/66, respirations 17, and sat 93% on room air.  GENERAL: He is pleasant, interactive, sitting up in bed. HEENT: Pupils reactive, left is cloudy. Oropharynx is clear with no thrush.  NECK: Supple.  HEART: Regular.  LUNGS: Decreased breath sounds in right base, otherwise clear.  ABDOMEN: Soft, nontender, nondistended. No hepatosplenomegaly.  EXTREMITIES: His left leg has no clubbing, cyanosis or edema. His right knee is in an immobilizer. On further evaluation, after removal, the knee is quite swollen and warm. There are no skin sores, ulcers or decubitus ulcers.  NEUROLOGIC: He has contractures of his right upper extremity and strength is  diminished, but he is alert and oriented.   DIAGNOSTIC DATA: Labs are reviewed.   CT of his head March 18th showed no intracranial abnormalities.   CT of the neck showed advanced cervical spondylosis, but negative for fracture.   Chest x-ray March 21st showed bilateral basilar segmental atelectasis.   X-ray of his knee showed acute mildly displaced transverse distal fracture of the femoral metaphysis.  The previously placed intramedullary rod provides no stability for this fracture.  White blood count on admission was 6.4 and currently 6.3, hemoglobin 10.3, platelets 144,000. On admission they were 126,000. LFTs on admission normal except for alk phos slightly elevated at 125. Renal function shows a creatinine of 2.86. GFR is 24. Blood cultures x2, March 20th, are negative. Urine culture, March 20th, is mixed bacterial flora. UA showed 22 white cells. Repeat urine culture March 21st was negative. Aspiration of the right knee, March 21st, was grossly bloody and had no evidence of growth.  IMPRESSION: A pleasant 74 year old gentleman wheelchair bound from prior cerebrovascular accidents admitted with a fall out of his wheelchair and fracture of his femur and following admission he then developed high fevers to 104 and has been febrile since. His white count remained stable and he is hemodynamically stable. He has been on vancomycin and ertapenem and actually got a dose of antibiotics prior to the knee tap. Analysis was not sent on the knee but culture is negative.   Several sources of his fever exists: 1.  Knee. I did re-aspirate the knee and will send it for cell count and differential, although it was grossly bloody. We will repeat cultures on it as well.  2.  Other sources would include a gastrointestinal source given his constipation. He does have a pelvic fracture. There would be some concern he has some hemoperitoneum or intra-abdominal abscesses, although he is really markedly nontender there and has started to move his bowels. He also has no white count. Drug fever could be a possibility, but the only drug on his list that could potentially cause this is hydralazine and he has been on this for some time. However, he is also on olanzapine, but that also seems chronic.  3.  Another source could be urine given that he is having some difficulty with urination.   RECOMMENDATIONS: 1.  I have tapped the  knee and will send it for cell count and differential and repeat culture.  2.  I have asked the nurse to bladder scan him to see if there are any issues with retained urine that may need a Foley catheter and further evaluation.  3.  I have checked an ESR and CRP as well as an HIV test.  4.  If this testing is negative, I would suggest we stop all antibiotics and monitor. I am not sure what we would be treating or duration or source and it would be best to monitor him off of antibiotics.   Thank you for the consult. I will be glad to follow with you.  ____________________________ Cheral Marker. Ola Spurr, MD dpf:sb D: 08/28/2013 15:30:00 ET T: 08/28/2013 15:53:39 ET JOB#: [id number]  cc: Cheral Marker. Ola Spurr, MD, <Dictator> Sabin Gibeault Ola Spurr MD ELECTRONICALLY SIGNED 09/09/2013 13:27

## 2014-09-29 NOTE — Consult Note (Signed)
Chief Complaint:  Subjective/Chief Complaint Right distal femur fracture with large knee effusion and fevers.  Asked to recheck patient by Dr Hilton SinclairWeiting because of onset of fevers last 24 hrs.  Transfer to skilled nursing facility held due to this.    Exam:  Patient alert but extremely warm with shaking chills.  Temp to 104*  Right knee with 4+ effusion but no warmer than other knee. No reddness or cellulitis.  circulation/sensation/motor function good distally. Minimal pain.   VITAL SIGNS/ANCILLARY NOTES: **Vital Signs.:   21-Mar-15 13:30  Vital Signs Type Routine  Temperature Temperature (F) 99.4  Celsius 37.4  Temperature Source oral  Pulse Pulse 72  Respirations Respirations 20  Systolic BP Systolic BP 122  Diastolic BP (mmHg) Diastolic BP (mmHg) 68  Mean BP 86  Pulse Ox % Pulse Ox % 93  Pulse Ox Activity Level  At rest  Oxygen Delivery Room Air/ 21 %   Brief Assessment:  EXTR negative edema   Additional Physical Exam as above   Lab Results: LabObservation:  21-Mar-15 15:32   OBSERVATION Reason for Test Swelling, pain knee, r/o dvt  Routine Hem:  21-Mar-15 05:02   Hemoglobin (CBC)  10.3 (Result(s) reported on 26 Aug 2013 at 05:45AM.)   Assessment/Plan:  Assessment/Plan:  Assessment Large right knee bloody effusion secondary to fracture. Fever of unknown origin   Plan Right knee aspirated of 120 cc of blood.  Some clotted blood remained.  Knee fluid sent for cultures aerobic and anaerobic Ace, ice, and knee immobilizer for knee. Fever workup by Dr Mody--discussed with her Repeat X-rays right knee to assess fracture position   Electronic Signatures: Valinda HoarMiller, Kashish Yglesias E (MD)  (Signed 21-Mar-15 19:36)  Authored: Chief Complaint, VITAL SIGNS/ANCILLARY NOTES, Brief Assessment, Lab Results, Assessment/Plan   Last Updated: 21-Mar-15 19:36 by Valinda HoarMiller, Sanay Belmar E (MD)

## 2015-04-01 IMAGING — CT CT HEAD WITHOUT CONTRAST
4 of 8 series · 13 of 33 positions shown, 14 images · non-contrast
Comparison: None.

CLINICAL DATA: Fall

EXAM:
CT HEAD WITHOUT CONTRAST
CT CERVICAL SPINE WITHOUT CONTRAST
TECHNIQUE: Multidetector CT imaging of the head and cervical spine was
performed following the standard protocol without intravenous
contrast. Multiplanar CT image reconstructions of the cervical spine
were also generated.

[Series 5: c spine soft · axial · 0.31mm/px · z∈[+312,+360]mm · 2 of 74 slices shown]
[im 25/74  soft-tissue]
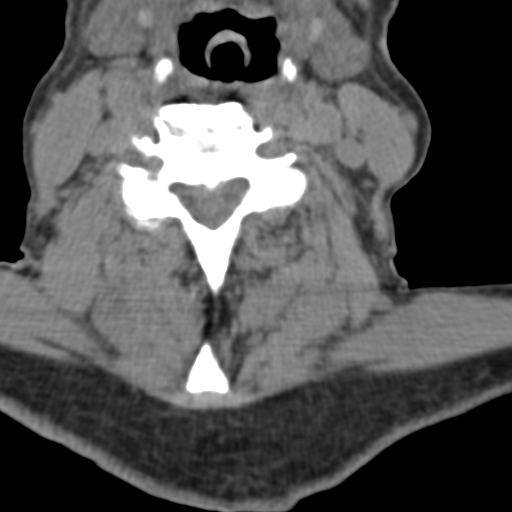
[im 49/74  soft-tissue]
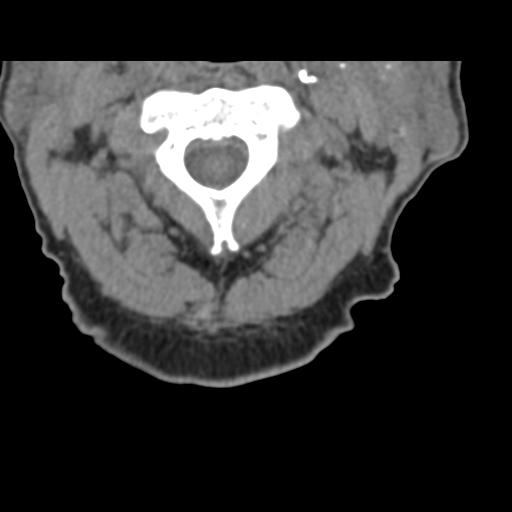

[Series 13: sag bone · sagittal · 0.37mm/px · 5 of 43 slices shown]
[im 8/43  bone]
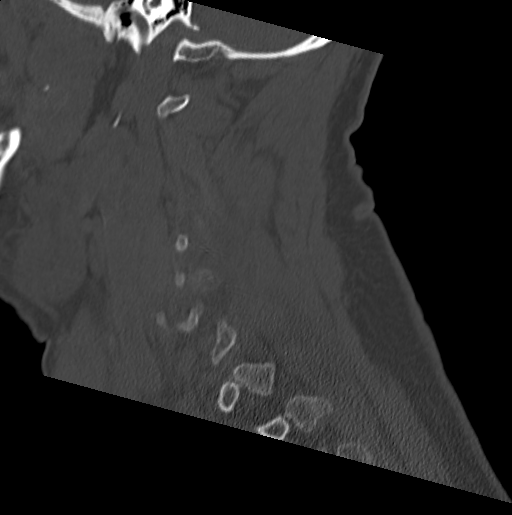
[im 15/43  bone]
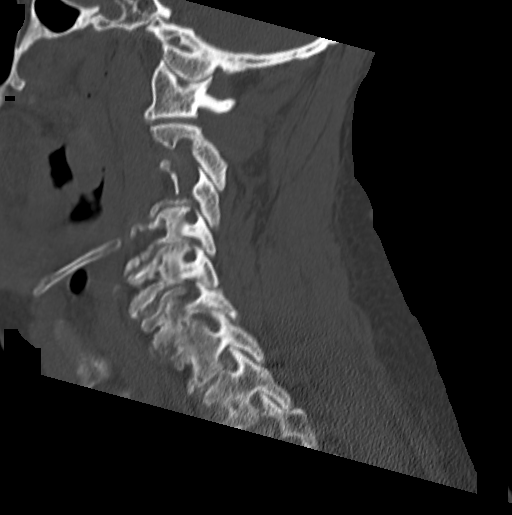
[im 22/43  bone]
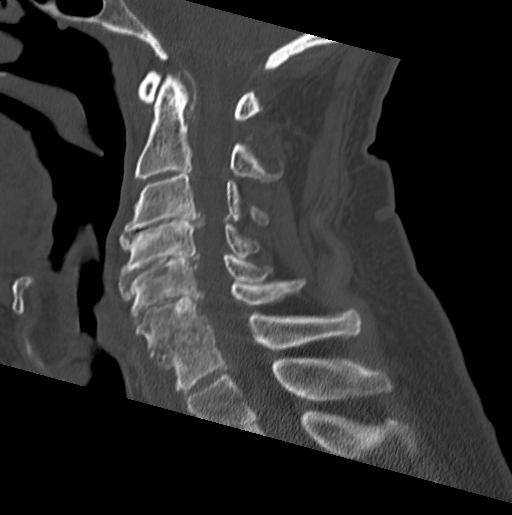
[im 29/43  bone]
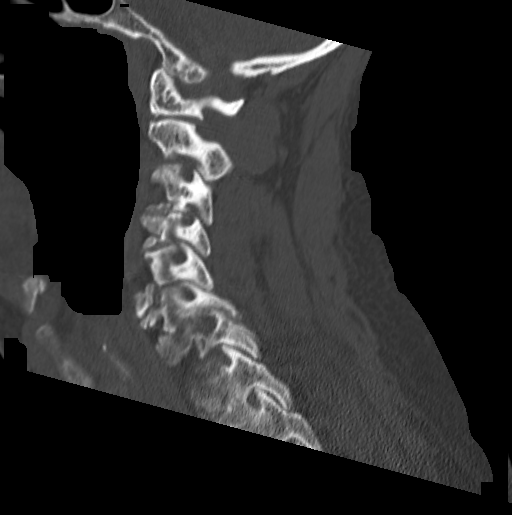
[im 36/43  bone]
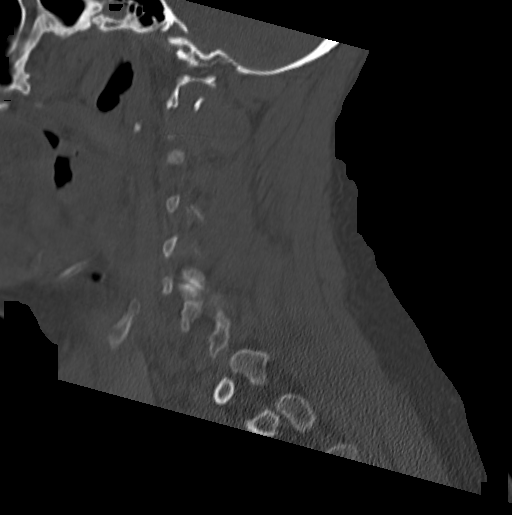

[Series 14: cor bone · coronal · 0.35mm/px · 2 of 47 slices shown]
[im 16/47  bone]
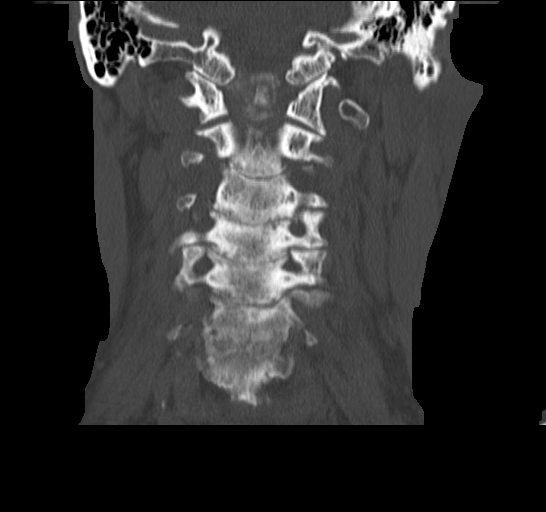
[im 31/47  bone]
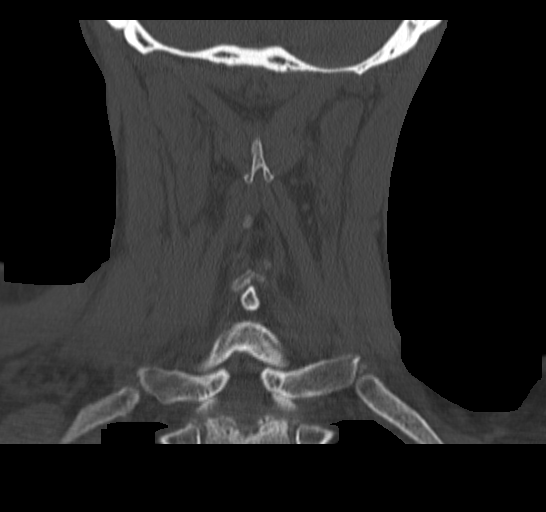

[Series 15: orthogonal axials · axial · 0.29mm/px · z∈[+256,+373]mm · 4 of 105 slices shown, 5 images]
[im 21/105  soft-tissue]
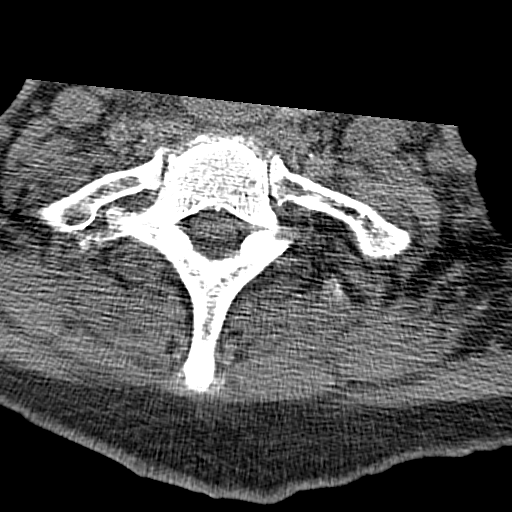
[im 21/105  bone]
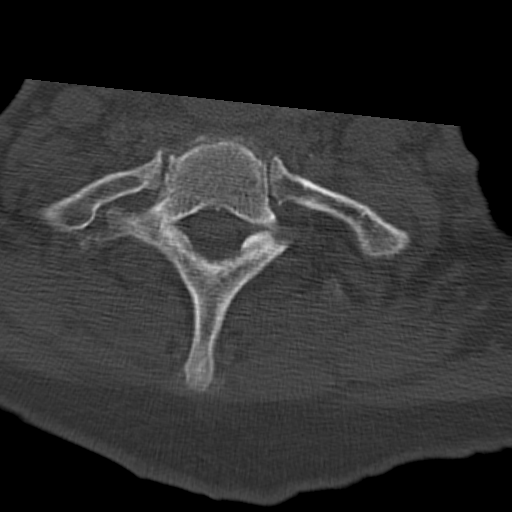
[im 42/105  bone]
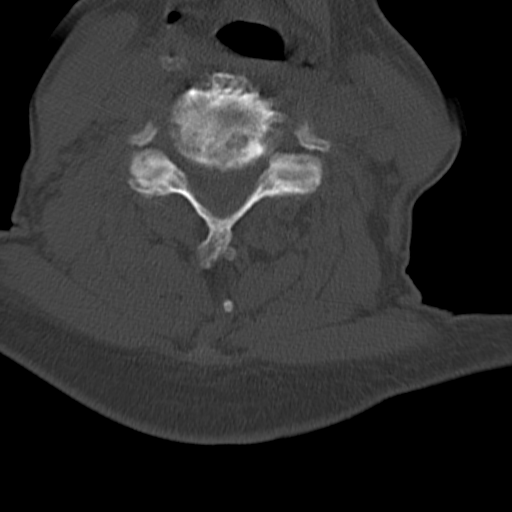
[im 63/105  bone]
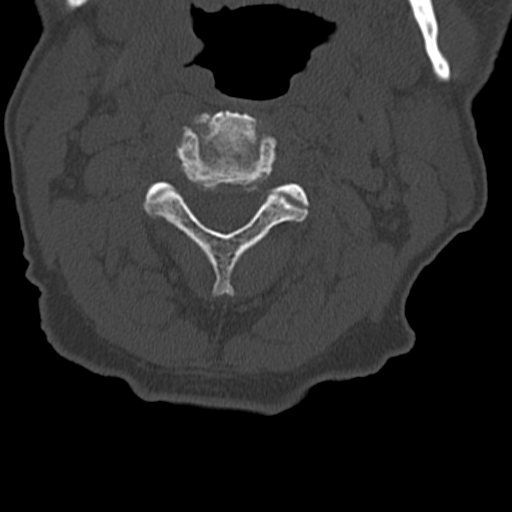
[im 84/105  bone]
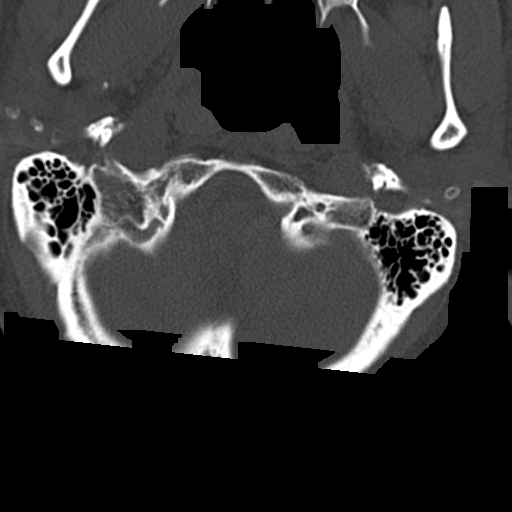

[13 of 33 positions shown; findings below may reference images not displayed]

FINDINGS: CT HEAD FINDINGS

Frontal scalp laceration and associated soft tissue swelling.
Negative for skull fracture. There is retention cyst in the right
frontal sinus.

Generalized atrophy with moderate chronic microvascular ischemia. No
acute infarct. Negative for hemorrhage or mass.

CT CERVICAL SPINE FINDINGS

Severe degenerative changes. Advanced spondylosis at C3-4, C4-5,
C5-6, C6-7. There is moderate spinal stenosis at C3-4 and C4-5 and
C5-6. 3 mm anterior slip C6-7 appears degenerative with facet
degeneration. There is also degenerative change at C1-C2 with
calcified pannus formation.

No fracture identified.
IMPRESSION: Frontal scalp laceration. No skull fracture and no acute
intracranial abnormality.

Advanced cervical spondylosis.  Negative for fracture.

## 2015-04-03 IMAGING — CR DG CHEST 2V
1 series · 4 of 4 positions shown · non-contrast
Comparison: PA and lateral chest 04/21/2012.

CLINICAL DATA: Fever.

EXAM:
CHEST  2 VIEW

[Series 10: x chest ap · 0.14mm/px · 4 of 4 slices shown]
[im 1/4]
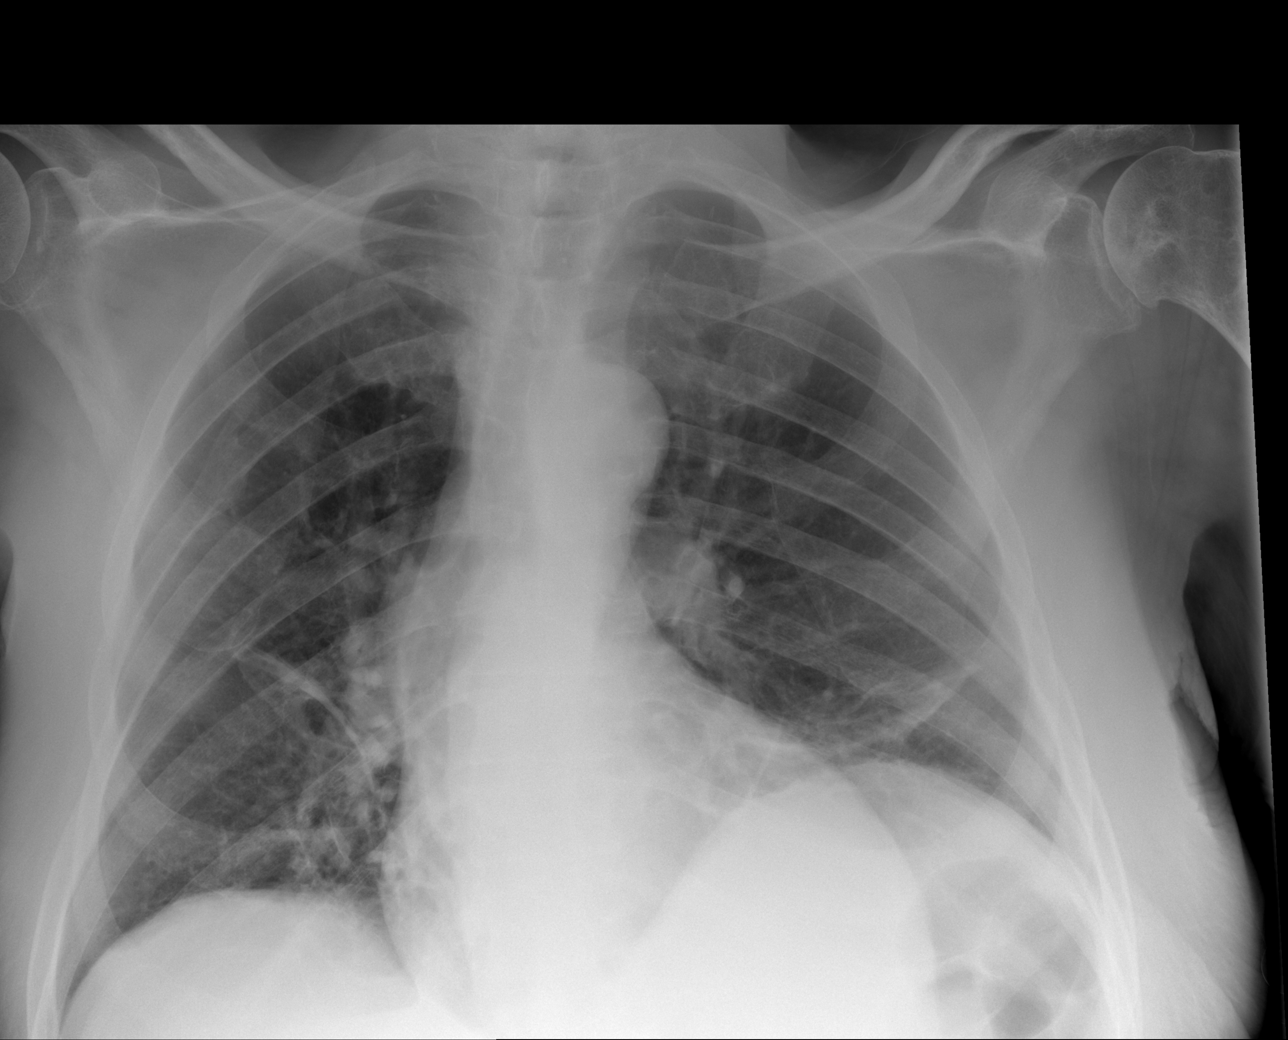
[im 2/4]
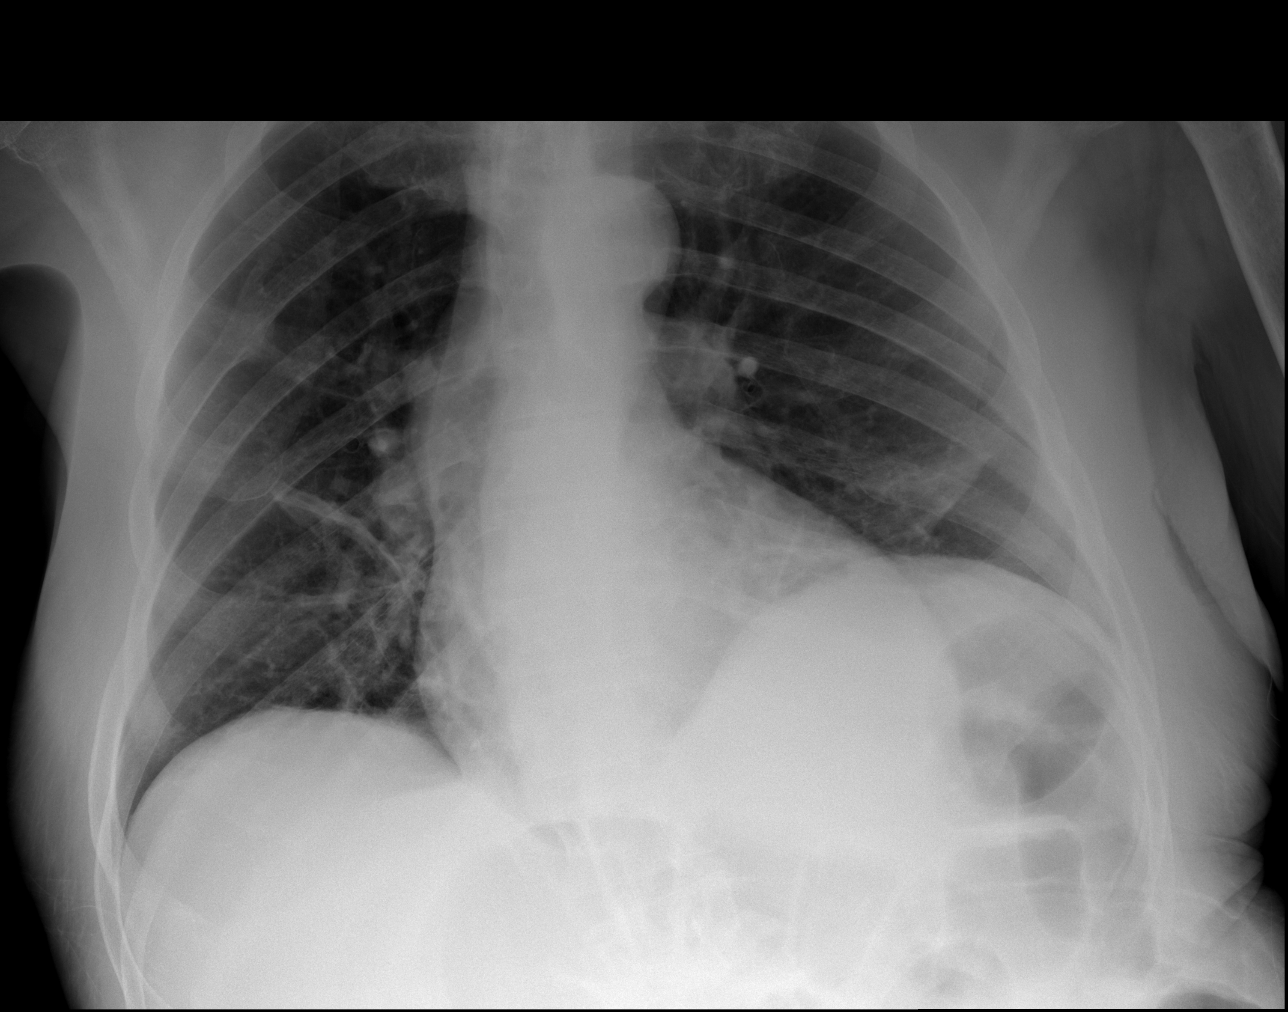
[im 3/4]
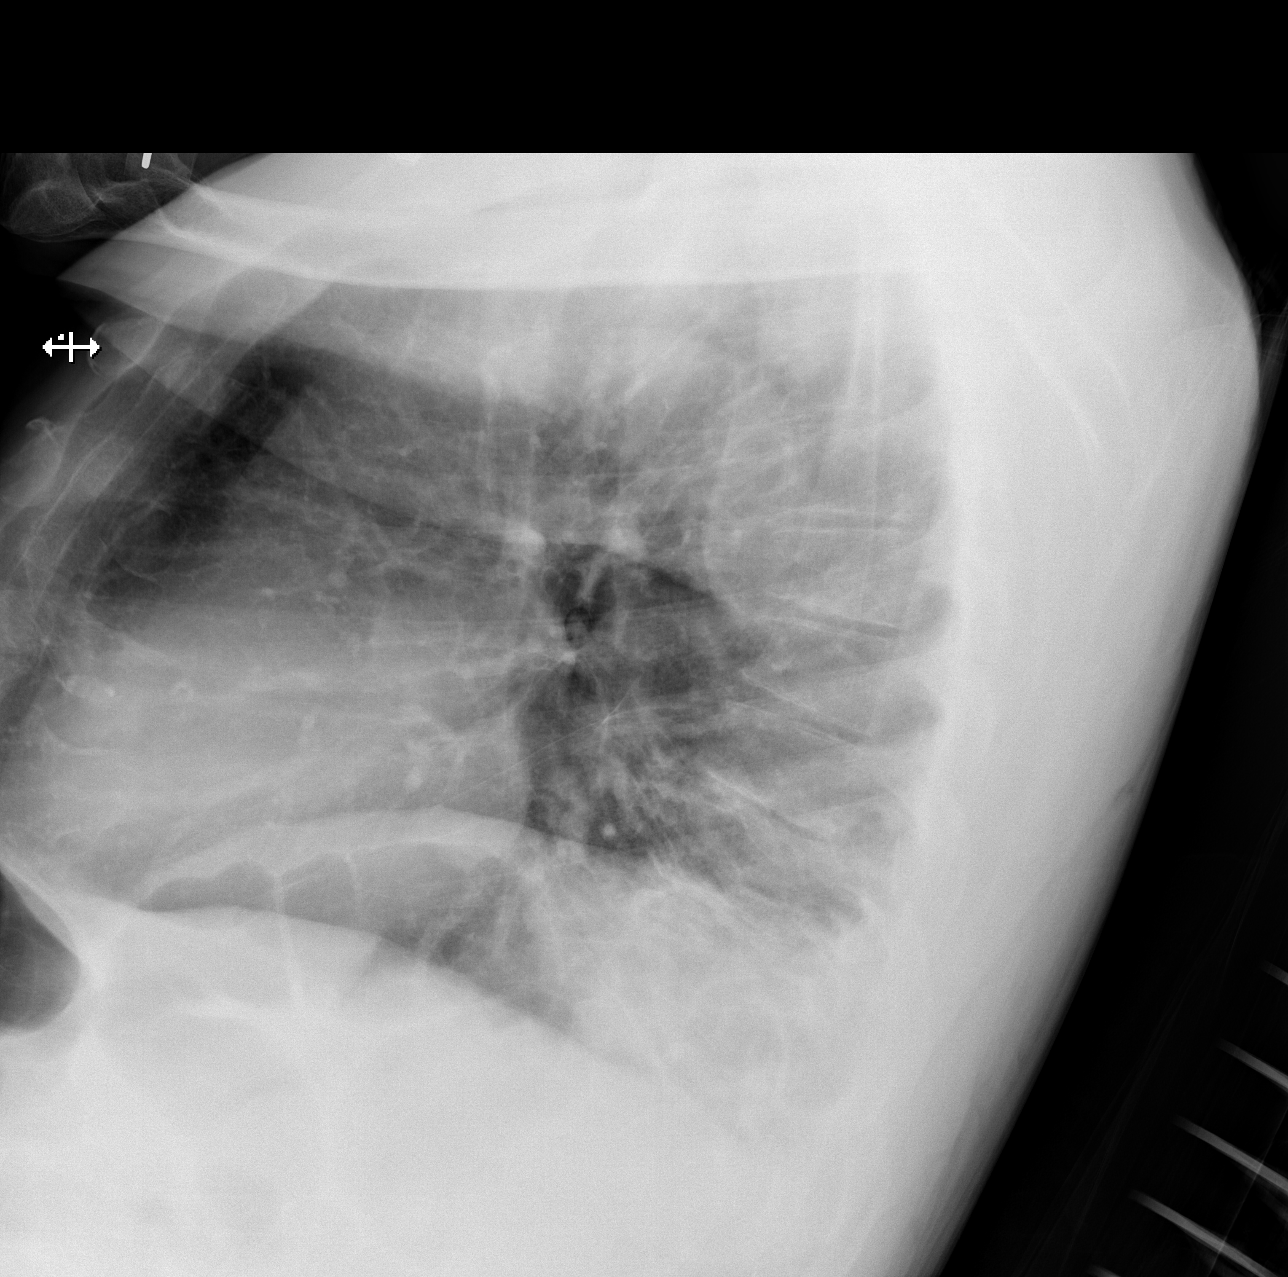
[im 4/4]
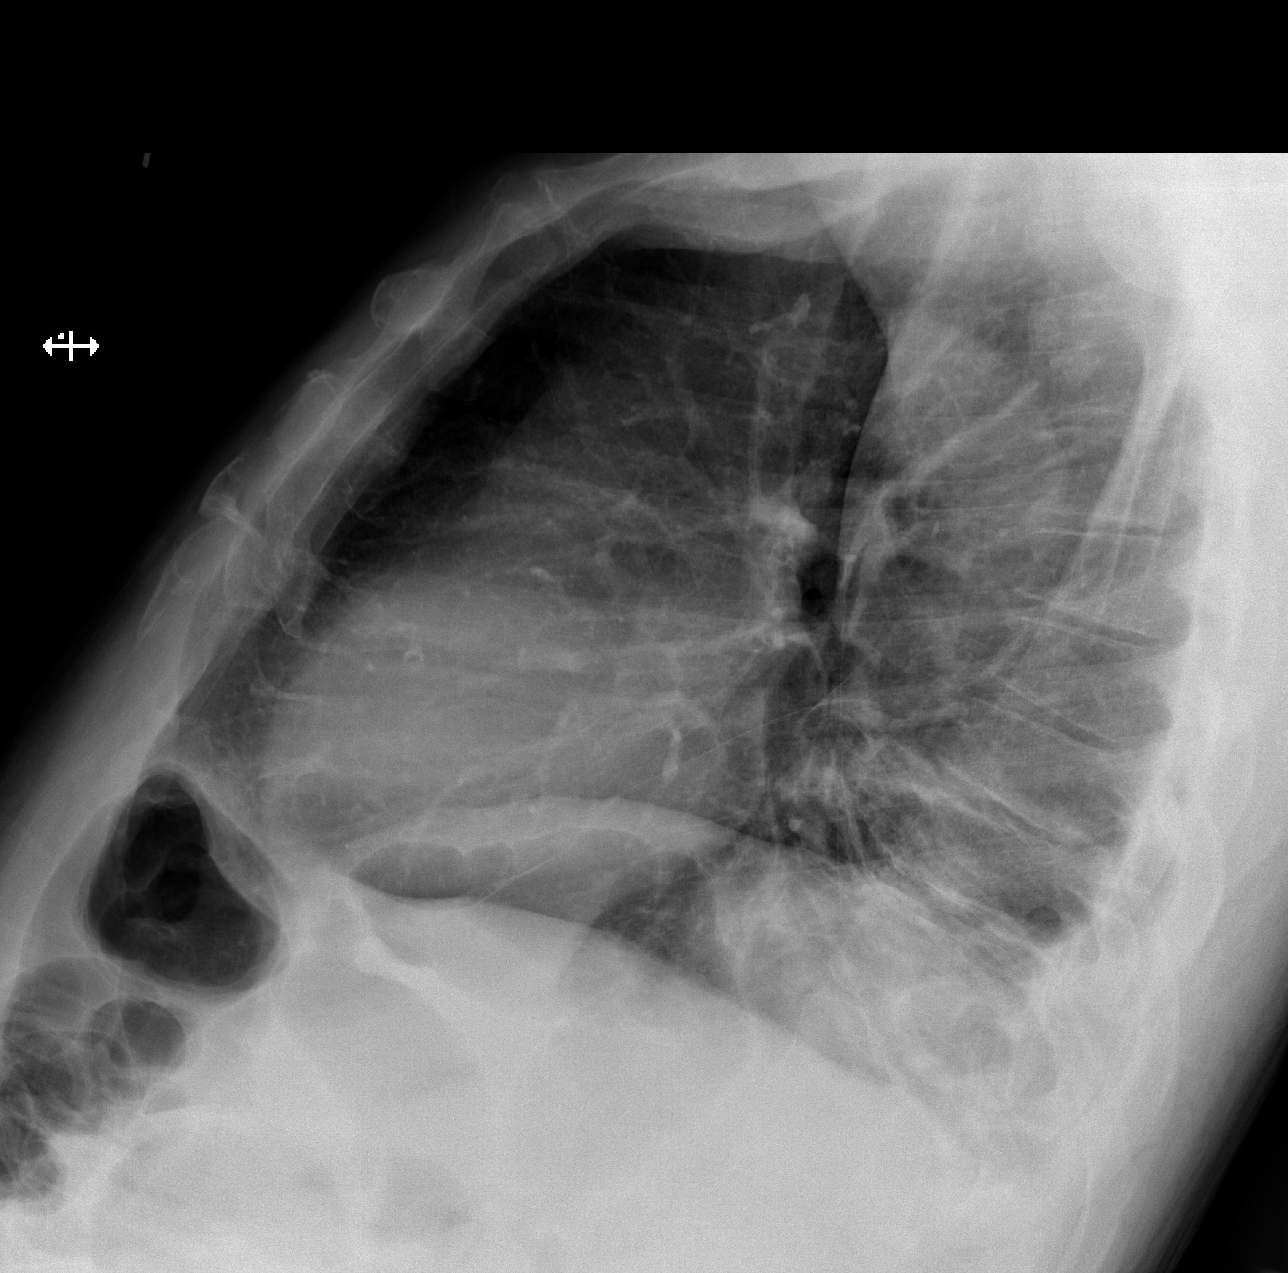

[4 of 4 positions shown; findings below may reference images not displayed]

FINDINGS: Mild discoid atelectasis is present in the lung bases. No
consolidative process, pneumothorax or effusion. Heart size is
mildly enlarged.
IMPRESSION: Mild bibasilar atelectasis.  No acute finding.

## 2015-04-04 IMAGING — CR DG KNEE 1-2V*R*
1 series · 2 of 2 positions shown · non-contrast
Comparison: August 23, 2013.

CLINICAL DATA: Right leg fracture.

EXAM:
RIGHT KNEE - 1-2 VIEW

[Series 1: ap · 0.17mm/px · 2 of 2 slices shown]
[im 1/2]
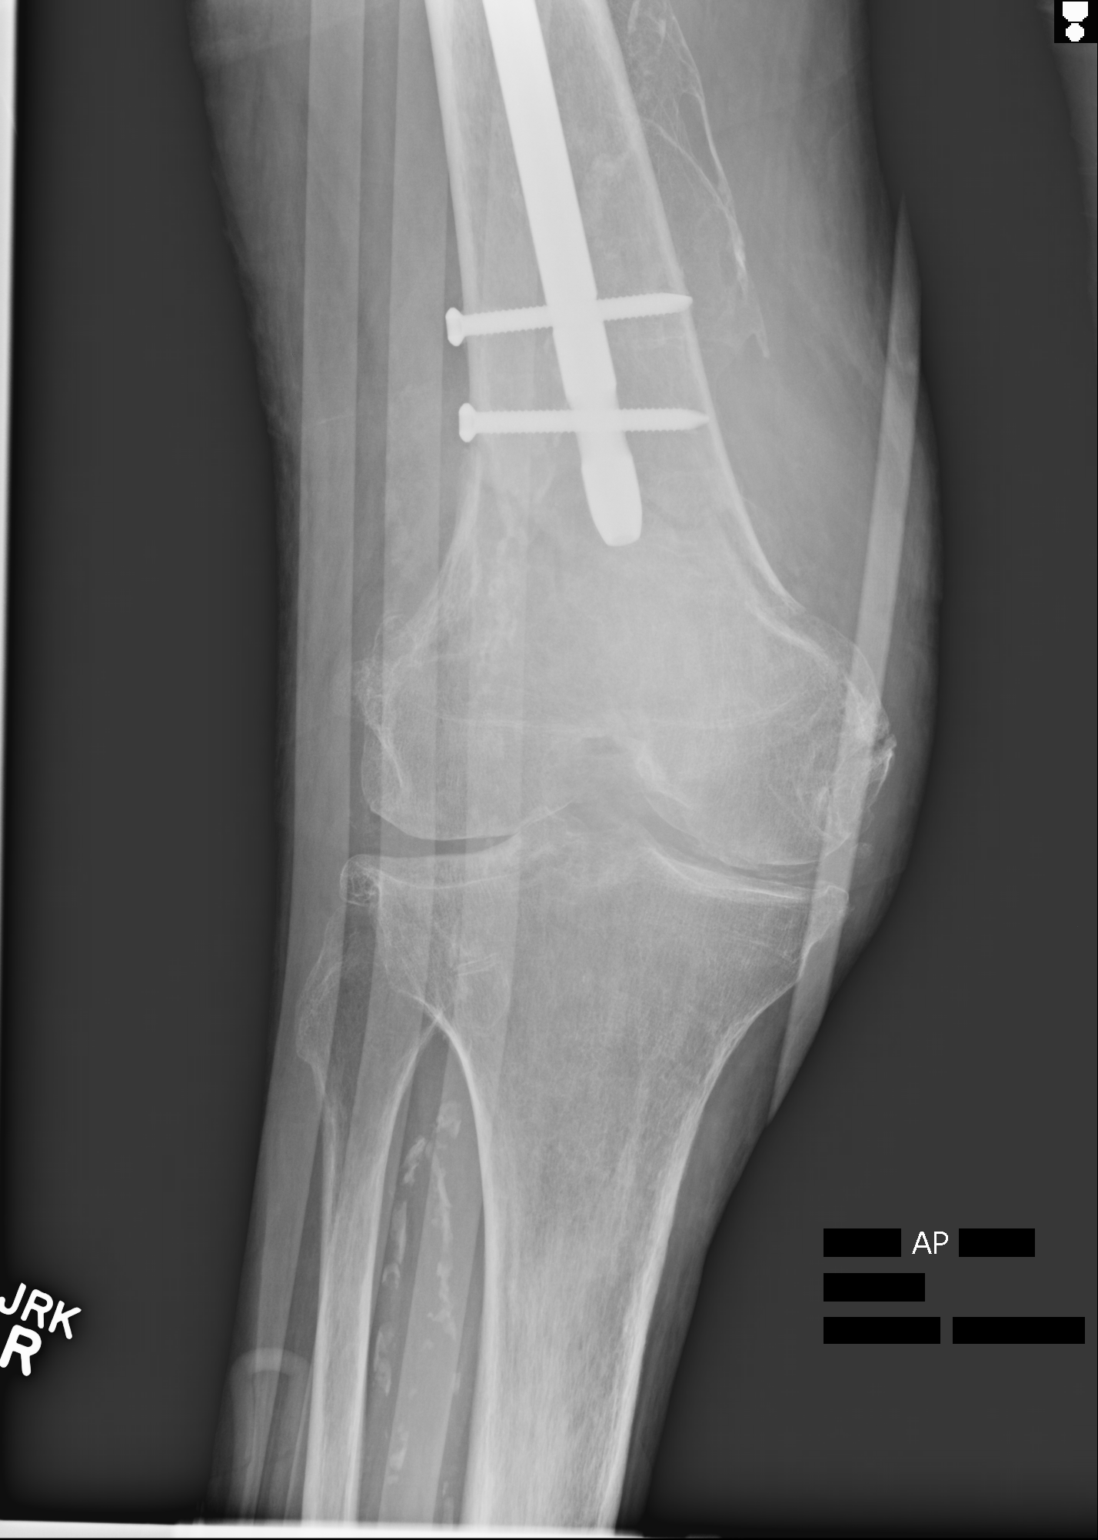
[im 2/2]
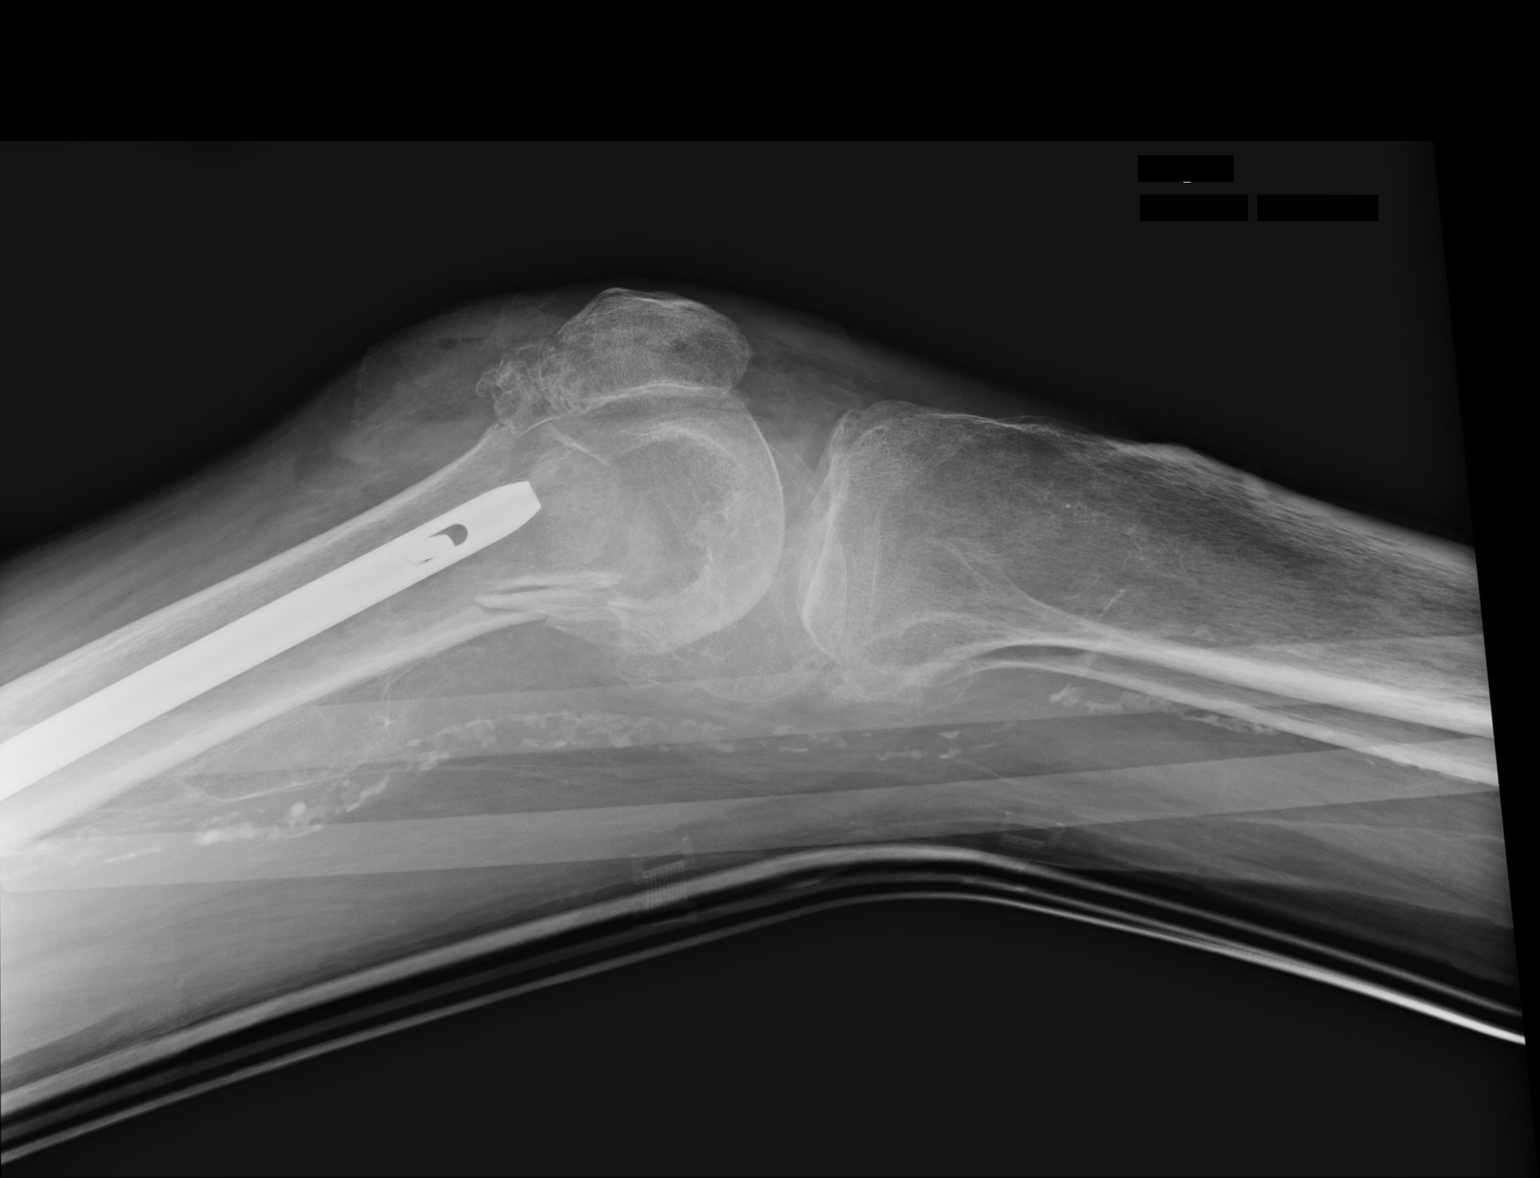

[2 of 2 positions shown; findings below may reference images not displayed]

FINDINGS: Intramedullary rod fixation of distal femur is noted. There is again
noted acute mildly displaced transverse fracture involving the
distal femoral metaphysis which is unchanged compared to prior exam
; the previously placed intramedullary rod provides no stability for
this new fracture. Degenerative joint disease of the knee joint is
noted.
IMPRESSION: Acute mildly displaced distal transverse fracture of the distal
femoral metaphysis is noted which was described on prior exam; the
previously placed intra medullary rod provides no stability for this
fracture.
